# Patient Record
Sex: Female | Born: 2000 | Race: White | Hispanic: No | Marital: Single | Smoking: Never smoker
Health system: Southern US, Community
[De-identification: ages and names within clinical notes are randomized; demographics above are authoritative.]

---
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
## 2022-08-08 ENCOUNTER — Ambulatory Visit
Admission: EM | Admit: 2022-08-08 | Discharge: 2022-08-08 | Disposition: A | Discharge status: Home / Self Care | Payer: No Typology Code available for payment source | Attending: Family Medicine | Admitting: Family Medicine

## 2022-08-08 ENCOUNTER — Encounter: Payer: Self-pay | Admitting: Emergency Medicine

## 2022-08-08 ENCOUNTER — Other Ambulatory Visit: Payer: Self-pay

## 2022-08-08 DIAGNOSIS — J020 Streptococcal pharyngitis: Secondary | ICD-10-CM | POA: Diagnosis not present

## 2022-08-08 LAB — POCT RAPID STREP A (OFFICE): Rapid Strep A Screen: POSITIVE — AB

## 2022-08-08 MED ORDER — IBUPROFEN 800 MG PO TABS: 800.0000 mg | ORAL_TABLET | Freq: Three times a day (TID) | ORAL | 0 refills | Status: AC | PRN

## 2022-08-08 MED ORDER — AMOXICILLIN 875 MG PO TABS: 875.0000 mg | ORAL_TABLET | Freq: Two times a day (BID) | ORAL | 0 refills | Status: AC

## 2022-08-08 MED ORDER — ACETAMINOPHEN 325 MG PO TABS
650.0000 mg | ORAL_TABLET | Freq: Once | ORAL | Status: AC
  Administered 2022-08-08: 650 mg via ORAL

## 2022-08-08 NOTE — ED Triage Notes (Signed)
Pt here with sore throat and fever that feels similar to when she has had strep in past; pt sts weakness and not feeling well

## 2022-08-08 NOTE — ED Provider Notes (Signed)
[facility]    CSN: [id number] Arrival date & time: 08/08/22  1110      History   Chief Complaint Chief Complaint  Patient presents with   Sore Throat    HPI Kayla Stephens is a 22 y.o. female.    Sore Throat   Here for sore throat and rhinorrhea.  Symptoms began a little bit on the evening of June 23 and then they got a lot worse yesterday morning.  She has had fever and headache also.  No vomiting or diarrhea    no cough   She is tolerating medication  Last menstrual cycle was about 3 and half weeks ago.   History reviewed. No pertinent past medical history.  There are no problems to display for this patient.   History reviewed. No pertinent surgical history.  OB History   No obstetric history on file.      Home Medications    Prior to Admission medications   Medication Sig Start Date End Date Taking? Authorizing Provider  amoxicillin (AMOXIL) 875 MG tablet Take 1 tablet (875 mg total) by mouth 2 (two) times daily for 10 days. 08/08/22 08/18/22 Yes Zenia Resides, MD  ibuprofen (ADVIL) 800 MG tablet Take 1 tablet (800 mg total) by mouth every 8 (eight) hours as needed (pain). 08/08/22  Yes Zenia Resides, MD    Family History History reviewed. No pertinent family history.  Social History Social History   Tobacco Use   Smoking status: Never   Smokeless tobacco: Never  Substance Use Topics   Alcohol use: Not Currently   Drug use: Never     Allergies   Patient has no known allergies.   Review of Systems Review of Systems   Physical Exam Triage Vital Signs ED Triage Vitals [08/08/22 1144]  Enc Vitals Group     BP 104/63     Pulse Rate (!) 130     Resp 18     Temp (!) 103.1 F (39.5 C)     Temp Source Oral     SpO2 96 %     Weight      Height      Head Circumference      Peak Flow      Pain Score 6     Pain Loc      Pain Edu?      Excl. in GC?    No data found.  Updated Vital Signs BP 104/63 (BP  Location: Left Arm)   Pulse (!) 130   Temp (!) 103.1 F (39.5 C) (Oral)   Resp 18   SpO2 96%   Visual Acuity Right Eye Distance:   Left Eye Distance:   Bilateral Distance:    Right Eye Near:   Left Eye Near:    Bilateral Near:     Physical Exam Vitals reviewed.  Constitutional:      General: She is not in acute distress.    Appearance: She is not toxic-appearing.  HENT:     Right Ear: Tympanic membrane and ear canal normal.     Left Ear: Tympanic membrane and ear canal normal.     Nose: Nose normal.     Mouth/Throat:     Mouth: Mucous membranes are moist.     Comments: There is beefy red erythema of the tonsils bilaterally with white exudate on the tonsils. Eyes:     Extraocular Movements: Extraocular movements intact.     Conjunctiva/sclera: Conjunctivae normal.  Pupils: Pupils are equal, round, and reactive to light.  Cardiovascular:     Rate and Rhythm: Normal rate and regular rhythm.     Heart sounds: No murmur heard. Pulmonary:     Effort: Pulmonary effort is normal. No respiratory distress.     Breath sounds: No stridor. No wheezing, rhonchi or rales.  Musculoskeletal:     Cervical back: Neck supple.  Lymphadenopathy:     Cervical: No cervical adenopathy.  Skin:    Capillary Refill: Capillary refill takes less than 2 seconds.     Coloration: Skin is not jaundiced or pale.  Neurological:     General: No focal deficit present.     Mental Status: She is alert and oriented to person, place, and time.  Psychiatric:        Behavior: Behavior normal.      UC Treatments / Results  Labs (all labs ordered are listed, but only abnormal results are displayed) Labs Reviewed  POCT RAPID STREP A (OFFICE) - Abnormal; Notable for the following components:      Result Value   Rapid Strep A Screen Positive (*)    All other components within normal limits    EKG   Radiology No results found.  Procedures Procedures (including critical care  time)  Medications Ordered in UC Medications  acetaminophen (TYLENOL) tablet 650 mg (650 mg Oral Given 08/08/22 1147)    Initial Impression / Assessment and Plan / UC Course  I have reviewed the triage vital signs and the nursing notes.  Pertinent labs & imaging results that were available during my care of the patient were reviewed by me and considered in my medical decision making (see chart for details).        Rapid strep is positive.  Amoxil was sent in to treat.  Ibuprofen send the pain Final Clinical Impressions(s) / UC Diagnoses   Final diagnoses:  Strep pharyngitis     Discharge Instructions      Strep test is positive 2 days into taking the antibiotics, throw away the toothbrush and begin using a new one. Patient is not contagious after 24 hours of antibiotics; a full 10 days should be completed to prevent rheumatic fever  Take amoxicillin 875 mg--1 tab twice daily for 10 days  Take ibuprofen 800 mg--1 tab every 8 hours as needed for pain.         ED Prescriptions     Medication Sig Dispense Auth. Provider   amoxicillin (AMOXIL) 875 MG tablet Take 1 tablet (875 mg total) by mouth 2 (two) times daily for 10 days. 20 tablet Zenia Resides, MD   ibuprofen (ADVIL) 800 MG tablet Take 1 tablet (800 mg total) by mouth every 8 (eight) hours as needed (pain). 21 tablet Spenser Harren, Janace Aris, MD      PDMP not reviewed this encounter.   Zenia Resides, MD 08/08/22 [phone]

## 2022-08-08 NOTE — Discharge Instructions (Signed)
Strep test is positive 2 days into taking the antibiotics, throw away the toothbrush and begin using a new one. Patient is not contagious after 24 hours of antibiotics; a full 10 days should be completed to prevent rheumatic fever  Take amoxicillin 875 mg--1 tab twice daily for 10 days  Take ibuprofen 800 mg--1 tab every 8 hours as needed for pain.
